# Patient Record
Sex: Male | Born: 1995 | Race: White | Hispanic: No | Marital: Single
Health system: Southern US, Community
[De-identification: ages and names within clinical notes are randomized; demographics above are authoritative.]

---
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
## 2018-06-19 ENCOUNTER — Ambulatory Visit: Payer: Self-pay | Admitting: Internal Medicine

## 2018-06-19 ENCOUNTER — Other Ambulatory Visit: Payer: Self-pay
